# Patient Record
Sex: Male | Born: 1985 | Race: Black or African American | Hispanic: No | Marital: Single | Smoking: Current every day smoker
Health system: Southern US, Community
[De-identification: ages and names within clinical notes are randomized; demographics above are authoritative.]

---

## 2018-07-26 ENCOUNTER — Emergency Department (HOSPITAL_COMMUNITY)
Admission: EM | Admit: 2018-07-26 | Discharge: 2018-07-26 | Disposition: A | Discharge status: Home / Self Care | Payer: No Typology Code available for payment source | Attending: Emergency Medicine | Admitting: Emergency Medicine

## 2018-07-26 ENCOUNTER — Emergency Department (HOSPITAL_COMMUNITY): Payer: No Typology Code available for payment source

## 2018-07-26 ENCOUNTER — Encounter (HOSPITAL_COMMUNITY): Payer: Self-pay

## 2018-07-26 DIAGNOSIS — S060X9A Concussion with loss of consciousness of unspecified duration, initial encounter: Secondary | ICD-10-CM

## 2018-07-26 DIAGNOSIS — Y939 Activity, unspecified: Secondary | ICD-10-CM | POA: Insufficient documentation

## 2018-07-26 DIAGNOSIS — Y92410 Unspecified street and highway as the place of occurrence of the external cause: Secondary | ICD-10-CM | POA: Insufficient documentation

## 2018-07-26 DIAGNOSIS — F17290 Nicotine dependence, other tobacco product, uncomplicated: Secondary | ICD-10-CM | POA: Diagnosis not present

## 2018-07-26 DIAGNOSIS — Y998 Other external cause status: Secondary | ICD-10-CM | POA: Diagnosis not present

## 2018-07-26 DIAGNOSIS — S0990XA Unspecified injury of head, initial encounter: Secondary | ICD-10-CM | POA: Diagnosis present

## 2018-07-26 LAB — CBC WITH DIFFERENTIAL/PLATELET
Abs Immature Granulocytes: 0.05 10*3/uL (ref 0.00–0.07)
Basophils Absolute: 0.1 10*3/uL (ref 0.0–0.1)
Basophils Relative: 1 %
Eosinophils Absolute: 0.2 10*3/uL (ref 0.0–0.5)
Eosinophils Relative: 2 %
HCT: 49.1 % (ref 39.0–52.0)
Hemoglobin: 15.6 g/dL (ref 13.0–17.0)
Immature Granulocytes: 1 %
Lymphocytes Relative: 21 %
Lymphs Abs: 1.9 10*3/uL (ref 0.7–4.0)
MCH: 28.3 pg (ref 26.0–34.0)
MCHC: 31.8 g/dL (ref 30.0–36.0)
MCV: 89.1 fL (ref 80.0–100.0)
Monocytes Absolute: 0.6 10*3/uL (ref 0.1–1.0)
Monocytes Relative: 7 %
Neutro Abs: 6.2 10*3/uL (ref 1.7–7.7)
Neutrophils Relative %: 68 %
Platelets: 260 10*3/uL (ref 150–400)
RBC: 5.51 MIL/uL (ref 4.22–5.81)
RDW: 13.2 % (ref 11.5–15.5)
WBC: 8.9 10*3/uL (ref 4.0–10.5)
nRBC: 0 % (ref 0.0–0.2)

## 2018-07-26 LAB — COMPREHENSIVE METABOLIC PANEL
ALT: 21 U/L (ref 0–44)
AST: 44 U/L — ABNORMAL HIGH (ref 15–41)
Albumin: 4.3 g/dL (ref 3.5–5.0)
Alkaline Phosphatase: 41 U/L (ref 38–126)
Anion gap: 13 (ref 5–15)
BUN: 11 mg/dL (ref 6–20)
CO2: 20 mmol/L — ABNORMAL LOW (ref 22–32)
Calcium: 9.1 mg/dL (ref 8.9–10.3)
Chloride: 106 mmol/L (ref 98–111)
Creatinine, Ser: 1.15 mg/dL (ref 0.61–1.24)
GFR calc Af Amer: >60 mL/min (ref >60)
GFR calc non Af Amer: >60 mL/min (ref >60)
Glucose, Bld: 106 mg/dL — ABNORMAL HIGH (ref 70–99)
Potassium: 4.9 mmol/L (ref 3.5–5.1)
Sodium: 139 mmol/L (ref 135–145)
Total Bilirubin: 1.3 mg/dL — ABNORMAL HIGH (ref 0.3–1.2)
Total Protein: 6.8 g/dL (ref 6.5–8.1)

## 2018-07-26 LAB — ETHANOL: Alcohol, Ethyl (B): 200 mg/dL — ABNORMAL HIGH (ref <10)

## 2018-07-26 NOTE — ED Notes (Addendum)
Pt ate a bite of sandwich and ambulated independently in hall without assistance; pt's gait is steady
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
## 2018-07-26 NOTE — ED Notes (Signed)
Pt discharged from ED; instructions provided and scripts given; Pt encouraged to return to ED if symptoms worsen and to f/u with PCP; Pt verbalized understanding of all instructions 
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
